# Patient Record
Sex: Male | Born: 1990 | Race: Black or African American | Hispanic: No | Marital: Single | Smoking: Current some day smoker
Health system: Southern US, Community
[De-identification: ages and names within clinical notes are randomized; demographics above are authoritative.]

---
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
## 2012-09-17 ENCOUNTER — Emergency Department: Payer: Self-pay | Admitting: Emergency Medicine

## 2012-09-17 LAB — CBC WITH DIFFERENTIAL/PLATELET
Basophil #: 0 10*3/uL (ref 0.0–0.1)
Basophil %: 0.6 %
Eosinophil #: 0 10*3/uL (ref 0.0–0.7)
HGB: 17 g/dL (ref 13.0–18.0)
Lymphocyte %: 32.2 %
MCH: 33 pg (ref 26.0–34.0)
MCHC: 35.2 g/dL (ref 32.0–36.0)
Monocyte #: 1.1 x10 3/mm — ABNORMAL HIGH (ref 0.2–1.0)
Neutrophil %: 51 %
Platelet: 169 10*3/uL (ref 150–440)
RDW: 13.1 % (ref 11.5–14.5)

## 2012-09-17 LAB — COMPREHENSIVE METABOLIC PANEL
BUN: 11 mg/dL (ref 7–18)
Bilirubin,Total: 1.2 mg/dL — ABNORMAL HIGH (ref 0.2–1.0)
Chloride: 101 mmol/L (ref 98–107)
Co2: 29 mmol/L (ref 21–32)
Creatinine: 1.06 mg/dL (ref 0.60–1.30)
EGFR (African American): >60
EGFR (Non-African Amer.): >60
Osmolality: 281 (ref 275–301)
SGPT (ALT): 42 U/L (ref 12–78)
Sodium: 141 mmol/L (ref 136–145)
Total Protein: 8.9 g/dL — ABNORMAL HIGH (ref 6.4–8.6)

## 2012-09-19 LAB — BETA STREP CULTURE(ARMC)

## 2018-01-29 ENCOUNTER — Emergency Department
Admission: EM | Admit: 2018-01-29 | Discharge: 2018-01-29 | Disposition: A | Discharge status: Home / Self Care | Payer: 59 | Attending: Emergency Medicine | Admitting: Emergency Medicine

## 2018-01-29 ENCOUNTER — Other Ambulatory Visit: Payer: Self-pay

## 2018-01-29 DIAGNOSIS — J020 Streptococcal pharyngitis: Secondary | ICD-10-CM | POA: Insufficient documentation

## 2018-01-29 DIAGNOSIS — R509 Fever, unspecified: Secondary | ICD-10-CM | POA: Diagnosis present

## 2018-01-29 MED ORDER — AMOXICILLIN 875 MG PO TABS: 875.0000 mg | ORAL_TABLET | Freq: Two times a day (BID) | ORAL | 0 refills | Status: AC

## 2018-01-29 NOTE — ED Provider Notes (Signed)
[facility] Emergency Department Provider Note  ____________________________________________  Time seen: Approximately 3:47 PM  I have reviewed the triage vital signs and the nursing notes.   HISTORY  Chief Complaint URI    HPI Stanley Burton is a 27 y.o. male presents to the emergency department with fever, chills, pharyngitis, headache and anterior neck pain for the past 2 days.  Patient reports that he feels like he is "swallowing glass".  He denies congestion, runny cough.  He has had malaise.  He denies chest pain, chest tightness and abdominal pain.  Patient is speaking in complete sentences and managing his own secretions.   No past medical history on file.  Patient Active Problem List   Diagnosis Date Noted  . ACUTE KIDNEY FAILURE UNSPECIFIED 02/02/2011  . RHABDOMYOLYSIS 02/02/2011     Prior to Admission medications   Medication Sig Start Date End Date Taking? Authorizing Provider  amoxicillin (AMOXIL) 875 MG tablet Take 1 tablet (875 mg total) by mouth 2 (two) times daily for 10 days. 01/29/18 02/08/18  Orvil Feil, PA-C    Allergies Patient has no known allergies.  No family history on file.  Social History Social History   Tobacco Use  . Smoking status: Not on file  Substance Use Topics  . Alcohol use: Not on file  . Drug use: Not on file     Review of Systems  Constitutional: Patient has fever.  Eyes: No visual changes. No discharge ENT: Patient has pharyngitis.  Cardiovascular: no chest pain. Respiratory: no cough. No SOB. Musculoskeletal: Negative for musculoskeletal pain. Skin: Negative for rash, abrasions, lacerations, ecchymosis. Neurological: Negative for headaches, focal weakness or numbness.   ____________________________________________   PHYSICAL EXAM:  VITAL SIGNS: ED Triage Vitals  Enc Vitals Group     BP 01/29/18 1425 (!) 114/59     Pulse Rate 01/29/18 1425 98     Resp 01/29/18 1425 16     Temp  01/29/18 1425 98.6 F (37 C)     Temp Source 01/29/18 1425 Oral     SpO2 01/29/18 1425 99 %     Weight 01/29/18 1413 165 lb (74.8 kg)     Height 01/29/18 1413 5\' 11"  (1.803 m)     Head Circumference --      Peak Flow --      Pain Score 01/29/18 1539 6     Pain Loc --      Pain Edu? --      Excl. in GC? --      Constitutional: Alert and oriented. Well appearing and in no acute distress. Eyes: Conjunctivae are normal. PERRL. EOMI. Head: Atraumatic. ENT:      Ears: TMs are pearly.      Nose: No congestion/rhinnorhea.      Mouth/Throat: Mucous membranes are moist.  Posterior pharynx is erythematous with bilateral tonsillar hypertrophy and exudate.  Uvula is midline. Hematological/Lymphatic/Immunilogical: Palpable cervical lymphadenopathy. Cardiovascular: Normal rate, regular rhythm. Normal S1 and S2.  Good peripheral circulation. Respiratory: Normal respiratory effort without tachypnea or retractions. Lungs CTAB. Good air entry to the bases with no decreased or absent breath sounds. Musculoskeletal: Full range of motion to all extremities. No gross deformities appreciated. Neurologic:  Normal speech and language. No gross focal neurologic deficits are appreciated.  Skin:  Skin is warm, dry and intact. No rash noted.  ____________________________________________   LABS (all labs ordered are listed, but only abnormal results are displayed)  Labs Reviewed - No data to display  ____________________________________________  EKG   ____________________________________________  RADIOLOGY   No results found.  ____________________________________________    PROCEDURES  Procedure(s) performed:    Procedures    Medications - No data to display   ____________________________________________   INITIAL IMPRESSION / ASSESSMENT AND PLAN / ED COURSE  Pertinent labs & imaging results that were available during my care of the patient were reviewed by me and considered in my  medical decision making (see chart for details).  Review of the [place] CSRS was performed in accordance of the [facility] prior to dispensing any controlled drugs.    Assessment and plan Strep pharyngitis Patient presents to the emergency department with fever, pharyngitis, tonsillar exudate and palpable cervical lymphadenopathy.  Differential diagnosis originally included strep pharyngitis, unspecified viral URI and influenza.  History and physical exam findings are consistent with strep throat at this time.  Patient was discharged with amoxicillin.  All patient questions were answered.   ____________________________________________  FINAL CLINICAL IMPRESSION(S) / ED DIAGNOSES  Final diagnoses:  Strep pharyngitis      NEW MEDICATIONS STARTED DURING THIS VISIT:  ED Discharge Orders        Ordered    amoxicillin (AMOXIL) 875 MG tablet  2 times daily     01/29/18 1531          This chart was dictated using voice recognition software/Dragon. Despite best efforts to proofread, errors can occur which can change the meaning. Any change was purely unintentional.    Orvil FeilWoods, Minsa Weddington M, PA-C 01/29/18 1553    Jeanmarie PlantMcShane, James A, MD 01/29/18 [phone]

## 2018-01-29 NOTE — ED Triage Notes (Signed)
Sinus drainage, sweats, cold symptoms x 2 days

## 2021-10-07 ENCOUNTER — Ambulatory Visit
Admission: EM | Admit: 2021-10-07 | Discharge: 2021-10-07 | Disposition: A | Discharge status: Home / Self Care | Payer: 59 | Attending: Emergency Medicine | Admitting: Emergency Medicine

## 2021-10-07 ENCOUNTER — Ambulatory Visit (INDEPENDENT_AMBULATORY_CARE_PROVIDER_SITE_OTHER): Payer: 59

## 2021-10-07 ENCOUNTER — Other Ambulatory Visit: Payer: Self-pay

## 2021-10-07 DIAGNOSIS — M545 Low back pain, unspecified: Secondary | ICD-10-CM | POA: Diagnosis not present

## 2021-10-07 DIAGNOSIS — S39012A Strain of muscle, fascia and tendon of lower back, initial encounter: Secondary | ICD-10-CM | POA: Diagnosis not present

## 2021-10-07 MED ORDER — BACLOFEN 10 MG PO TABS: 10.0000 mg | ORAL_TABLET | Freq: Three times a day (TID) | ORAL | 0 refills | Status: DC

## 2021-10-07 MED ORDER — IBUPROFEN 600 MG PO TABS: 600.0000 mg | ORAL_TABLET | Freq: Four times a day (QID) | ORAL | 0 refills | Status: DC | PRN

## 2021-10-07 NOTE — ED Provider Notes (Signed)
[facility]    CSN: [id number] Arrival date & time: 10/07/21  1001      History   Chief Complaint Chief Complaint  Patient presents with   Back Pain    Lower     HPI Stanley Burton is a 30 y.o. male.   HPI  30 year old male here for evaluation of low back pain.  Patient reports that he developed low back pain 9 days ago while doing a dead lift at the gym.  He states that she had been slowly increasing weight until he maxed out at 475 pounds.  He states that he was not wearing his weight belt when he did the left.  He states when he first took a strain he felt 3 pops in his low back but then continued through to complete the dead lift before dropping the weight.  He states that since then he has had continuous pain in his low back.  He also reports that a couple days after the pain developed he noticed some bruising but states that the bruising has resolved.  He states that the pain increases with forward flexion and lateral rotation but not with lateral flexion.  He denies numbness, tingling, or weakness in any of his legs or loss of bowel or bladder control.  History reviewed. No pertinent past medical history.  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 No pertinent surgical history.     Home Medications    Prior to Admission medications   Medication Sig Start Date End Date Taking? Authorizing Provider  baclofen (LIORESAL) 10 MG tablet Take 1 tablet (10 mg total) by mouth 3 (three) times daily. 10/07/21  Yes Becky Augusta, NP  ibuprofen (ADVIL) 600 MG tablet Take 1 tablet (600 mg total) by mouth every 6 (six) hours as needed. 10/07/21  Yes Becky Augusta, NP    Family History History reviewed. No pertinent family history.  Social History Social History   Tobacco Use   Smoking status: Some Days    Types: Cigarettes   Smokeless tobacco: Never   Vaping Use   Vaping Use: Never used     Allergies   Patient has no known allergies.   Review of Systems Review of Systems  Constitutional:  Negative for activity change, appetite change and fever.  Musculoskeletal:  Positive for back pain.  Skin:  Positive for color change.  Neurological:  Negative for weakness and numbness.  Hematological: Negative.   Psychiatric/Behavioral: Negative.      Physical Exam Triage Vital Signs ED Triage Vitals  Enc Vitals Group     BP 10/07/21 1041 117/62     Pulse Rate 10/07/21 1041 (!) 57     Resp 10/07/21 1041 18     Temp 10/07/21 1041 97.9 F (36.6 C)     Temp Source 10/07/21 1041 Oral     SpO2 10/07/21 1041 100 %     Weight 10/07/21 1039 185 lb (83.9 kg)     Height 10/07/21 1039 5\' 11"  (1.803 m)     Head Circumference --      Peak Flow --      Pain Score 10/07/21 1038 8     Pain Loc --      Pain Edu? --      Excl. in GC? --    No data found.  Updated Vital Signs BP 117/62 (BP Location: Left Arm)  Pulse (!) 57   Temp 97.9 F (36.6 C) (Oral)   Resp 18   Ht 5\' 11"  (1.803 m)   Wt 185 lb (83.9 kg)   SpO2 100%   BMI 25.80 kg/m   Visual Acuity Right Eye Distance:   Left Eye Distance:   Bilateral Distance:    Right Eye Near:   Left Eye Near:    Bilateral Near:     Physical Exam Vitals and nursing note reviewed.  Constitutional:      General: He is not in acute distress.    Appearance: Normal appearance. He is not ill-appearing.  HENT:     Head: Normocephalic and atraumatic.  Cardiovascular:     Rate and Rhythm: Normal rate and regular rhythm.     Pulses: Normal pulses.     Heart sounds: Normal heart sounds. No murmur heard.   No gallop.  Pulmonary:     Effort: Pulmonary effort is normal.     Breath sounds: Normal breath sounds. No wheezing, rhonchi or rales.  Musculoskeletal:        General: Tenderness present. No swelling or deformity.  Skin:    General: Skin is warm and dry.     Capillary Refill:  Capillary refill takes less than 2 seconds.     Findings: No bruising or erythema.  Neurological:     General: No focal deficit present.     Mental Status: He is alert and oriented to person, place, and time.     Sensory: No sensory deficit.     Motor: No weakness.  Psychiatric:        Mood and Affect: Mood normal.        Behavior: Behavior normal.        Thought Content: Thought content normal.        Judgment: Judgment normal.     UC Treatments / Results  Labs (all labs ordered are listed, but only abnormal results are displayed) Labs Reviewed - No data to display  EKG   Radiology DG Lumbar Spine Complete  Result Date: 10/07/2021 CLINICAL DATA:  Pain in lower back after 475 pound dead lift EXAM: LUMBAR SPINE - COMPLETE 4+ VIEW COMPARISON:  CT AP 01/26/2011 FINDINGS: Six lumbar type vertebral bodies are identified with sacralization of the L6 vertebra. Mild curvature of the thoracolumbar spine is convex towards the left. There is no evidence of lumbar spine fracture. Alignment is normal. Intervertebral disc spaces are maintained. IMPRESSION: No acute findings. Electronically Signed   By: 01/28/2011 M.D.   On: 10/07/2021 12:10    Procedures Procedures (including critical care time)  Medications Ordered in UC Medications - No data to display  Initial Impression / Assessment and Plan / UC Course  I have reviewed the triage vital signs and the nursing notes.  Pertinent labs & imaging results that were available during my care of the patient were reviewed by me and considered in my medical decision making (see chart for details).  Patient is a nontoxic-appearing 30 year old male here for evaluation of lumbar spine pain that is in the midline that is been present for the last 9 days.  The pain developed as the patient was doing a dead lift at his maximum weight of 475 pounds.  He states that when he initially took tension on the bar he felt 3 pops in his lower back but he  continued through with the remainder of the left.  He states rather than set the weight down he did  drop it at that point.  He has had no numbness, tingling, weakness in any of extremities and he does not have any incontinence or loss of bowel or bladder control.  He states that he was some bruising in his lower lumbar area that has since resolved.  The bruising developed a few days after he felt the pain.  Patient's physical exam reveals patient with a normal axial carriage.  He is able to transition from standing to sitting and vice versa.  There is no bruising visible on the low back upon inspection.  Patient does have midline spinous process tenderness from L3-S1.  There is tightness in the bilateral paraspinous regions but patient is not complaining of pain with palpation of those areas.  Patient can get approximately 15 degrees of forward flexion before he starts to feel pain and he can get approximately 45 degrees of lateral rotation in both directions before he invokes pain.  Lateral flexion does not cause pain at all.  Patient's bilateral lower extremity strength is 5/5.  Patient's lower extremity DTRs are nonreactive bilaterally.  He states that this is not a new finding for him he has never had any triggering with testing of his patellar or Achilles reflexes.  We will obtain lumbar spine films to look for bony abnormality such as compression wedge fracture or for loss of disc height.  Lumbar spine films independently reviewed and evaluated by me.  Impression: There is a loss of lumbar lordosis but disc spaces are well-maintained.  There is no abnormalities to any of the vertebral bodies.  All the spinous processes are intact.  Radiology overread is pending. Radiology impression is negative for acute findings.  We will treat patient for lumbar strain with NSAIDs, baclofen, rest, and home physical therapy.   Final Clinical Impressions(s) / UC Diagnoses   Final diagnoses:  Strain of lumbar region,  initial encounter     Discharge Instructions      Take the ibuprofen, 600 mg every 6 hours with food, on a schedule for the next 48 hours and then as needed.  Take the baclofen, 10 mg every 8 hours, on a schedule for the next 48 hours and then as needed.  Apply moist heat to your back for 30 minutes at a time 2-3 times a day to improve blood flow to the area and help remove the lactic acid causing the spasm.  Follow the back exercises given at discharge.  Avoid any heavy lifting or back strain until after symptoms have completely resolved.  Return for reevaluation for any new or worsening symptoms.      ED Prescriptions     Medication Sig Dispense Auth. Provider   baclofen (LIORESAL) 10 MG tablet Take 1 tablet (10 mg total) by mouth 3 (three) times daily. 30 each Becky Augusta, NP   ibuprofen (ADVIL) 600 MG tablet Take 1 tablet (600 mg total) by mouth every 6 (six) hours as needed. 30 tablet Becky Augusta, NP      PDMP not reviewed this encounter.   Becky Augusta, NP 10/07/21 1226

## 2021-10-07 NOTE — Discharge Instructions (Addendum)
Take the ibuprofen, 600 mg every 6 hours with food, on a schedule for the next 48 hours and then as needed.  Take the baclofen, 10 mg every 8 hours, on a schedule for the next 48 hours and then as needed.  Apply moist heat to your back for 30 minutes at a time 2-3 times a day to improve blood flow to the area and help remove the lactic acid causing the spasm.  Follow the back exercises given at discharge.  Avoid any heavy lifting or back strain until after symptoms have completely resolved.  Return for reevaluation for any new or worsening symptoms.

## 2021-10-07 NOTE — ED Triage Notes (Signed)
Pt c/o back pain since doing a dead-lift of 475lbs April 22, 2023 a week ago, states he felt three "pops" when lifting. Pt states within a few days he did notice some bruising, and the pain has increased, worse with movement/ambulation.

## 2022-02-25 IMAGING — CR DG LUMBAR SPINE COMPLETE 4+V
5 series · 5 of 5 positions shown · non-contrast
Comparison: CT AP 01/26/2011

CLINICAL DATA: Pain in lower back after 475 pound dead lift

EXAM:
LUMBAR SPINE - COMPLETE 4+ VIEW

[l-spine ap]
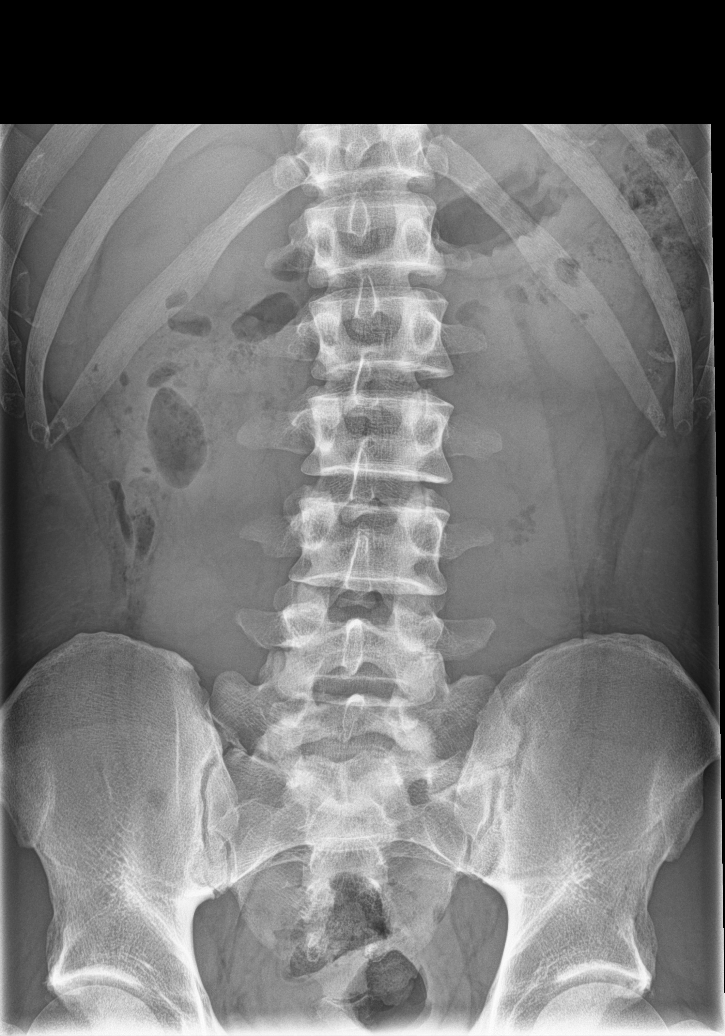

[l-spine obl (1 of 2)]
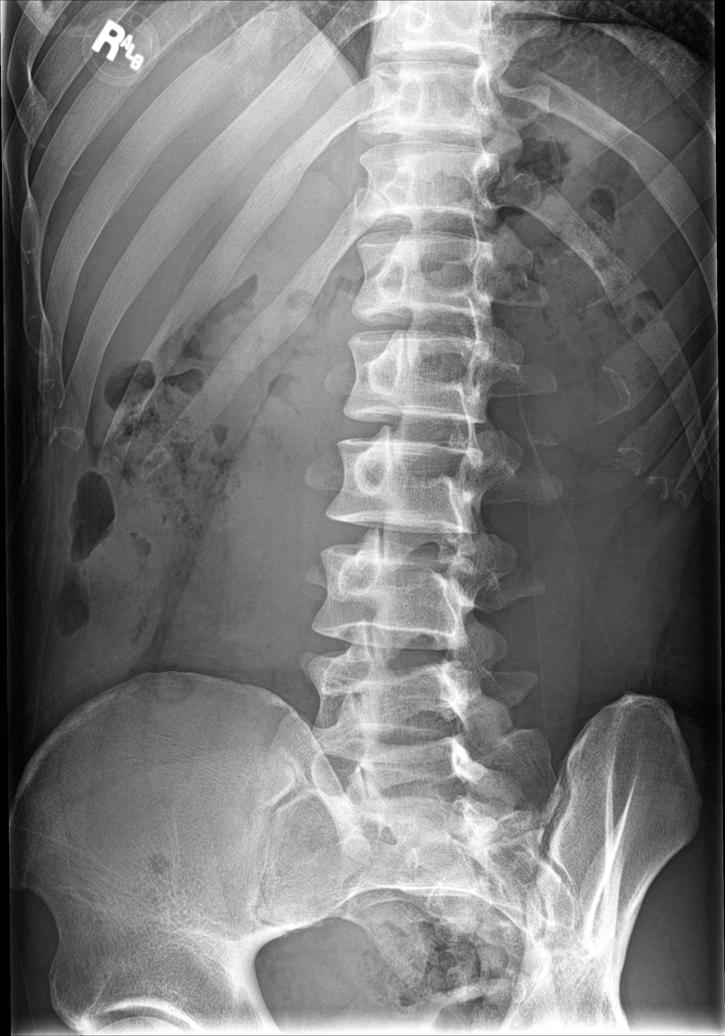

[l-spine obl (2 of 2)]
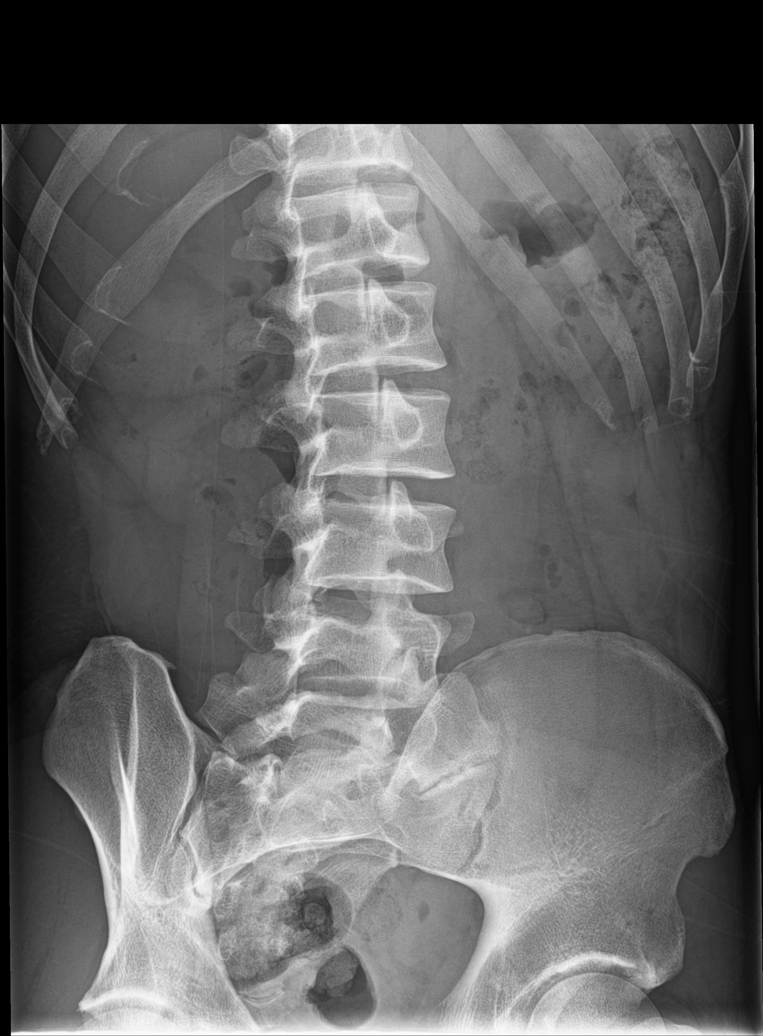

[l-spine lat]
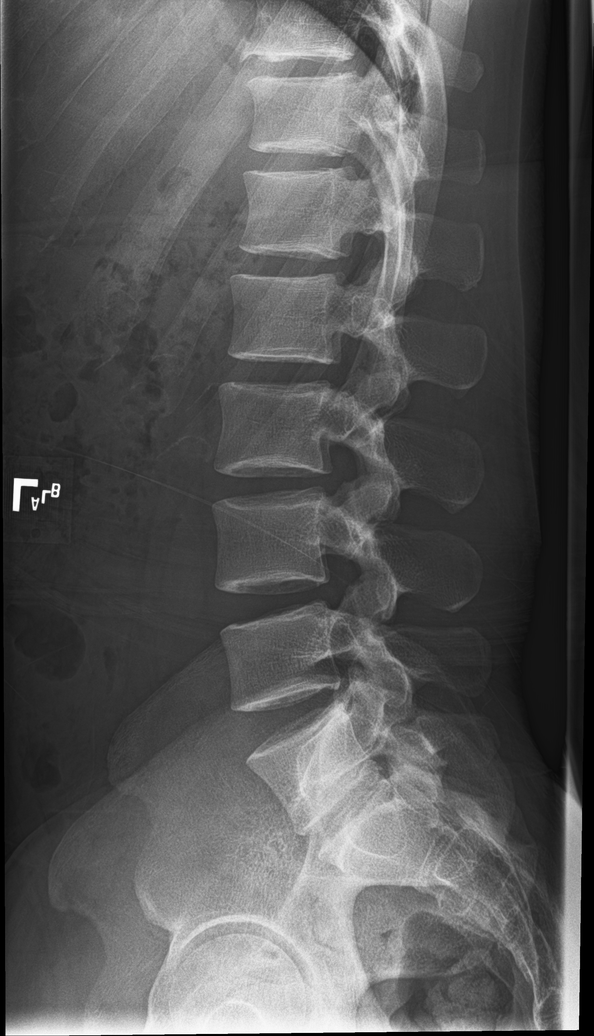

[l-spine spot]
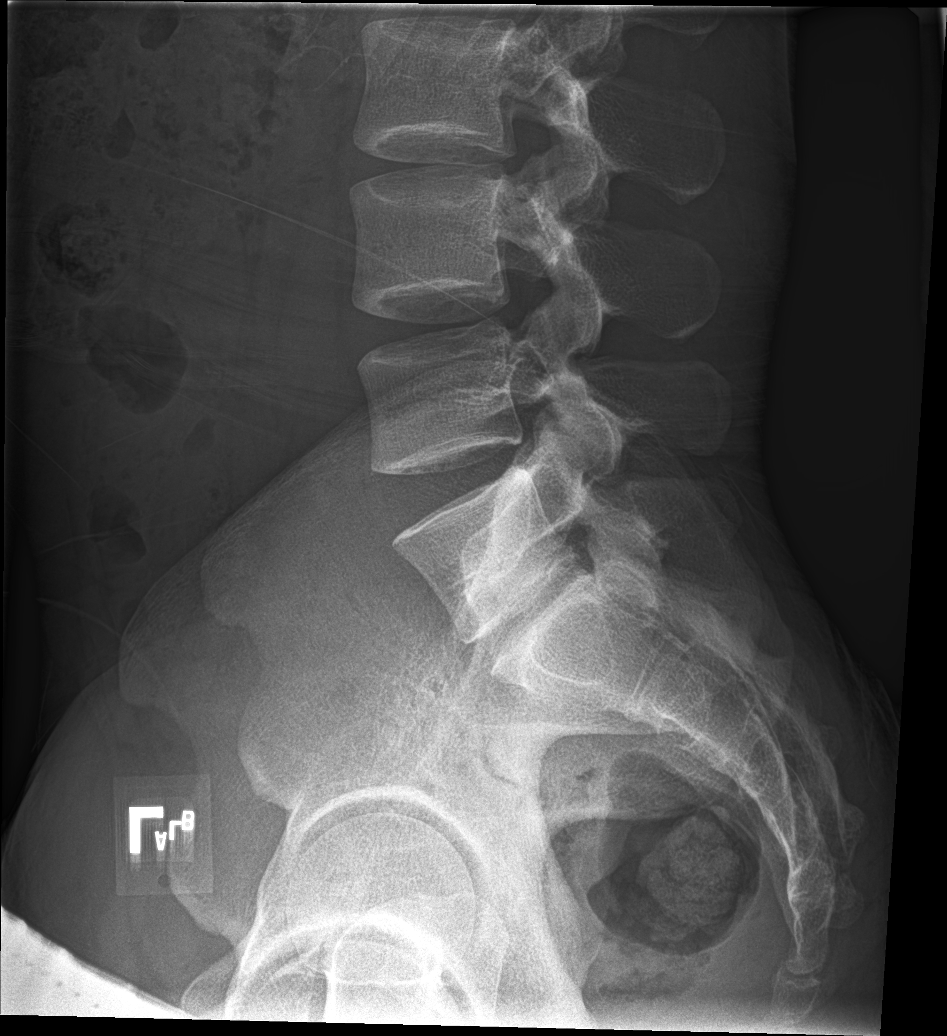

[5 of 5 positions shown; findings below may reference images not displayed]

FINDINGS: Six lumbar type vertebral bodies are identified with sacralization
of the L6 vertebra. Mild curvature of the thoracolumbar spine is
convex towards the left. There is no evidence of lumbar spine
fracture. Alignment is normal. Intervertebral disc spaces are
maintained.
IMPRESSION: No acute findings.

## 2023-11-14 ENCOUNTER — Ambulatory Visit: Payer: Self-pay | Admitting: Family Medicine

## 2023-11-14 ENCOUNTER — Encounter: Payer: Self-pay | Admitting: Family Medicine

## 2023-11-14 ENCOUNTER — Other Ambulatory Visit: Payer: Self-pay

## 2023-11-14 DIAGNOSIS — Z113 Encounter for screening for infections with a predominantly sexual mode of transmission: Secondary | ICD-10-CM

## 2023-11-14 DIAGNOSIS — F419 Anxiety disorder, unspecified: Secondary | ICD-10-CM

## 2023-11-14 LAB — HM HIV SCREENING LAB: HM HIV Screening: NEGATIVE

## 2023-11-14 LAB — HM HEPATITIS C SCREENING LAB: HM Hepatitis Screen: NEGATIVE

## 2023-11-14 LAB — HEPATITIS B SURFACE ANTIGEN

## 2023-11-14 LAB — GRAM STAIN

## 2023-11-14 NOTE — Progress Notes (Signed)
Pt is here for STD screening. Condoms given and counseled on the lab results. Sonda Primes, RN.

## 2023-11-14 NOTE — Progress Notes (Signed)

## 2023-11-14 NOTE — Progress Notes (Signed)
[facility] Department STI clinic/screening visit  Subjective:  Stanley Burton is a 32 y.o. male being seen today for an STI screening visit. The patient reports they do have symptoms.    Patient has the following medical conditions:   Patient Active Problem List   Diagnosis Date Noted   ACUTE KIDNEY FAILURE UNSPECIFIED 02/02/2011   RHABDOMYOLYSIS 02/02/2011     Chief Complaint  Patient presents with   SEXUALLY TRANSMITTED DISEASE    Screening with no symptoms     Patient presents for STI testing. He reports ending a relationship in the last month and wanting to be screened prior to a new sexual relationship. He endorses tingling at the tip of his penis with urination at times that began 1-2 weeks after last relationship ended. He denies all other STI symtpoms.  Patient has male partners and practices oral and insertive penile/vaginal sex. He sometimes uses condoms. He does not desire pregnancy in the next year.  Additionally, patient reports anxiety and depression that he self treats with marijuana and alcohol. He also endorses isolating himself.   Last HIV test per patient/review of record was No results found for: "HMHIVSCREEN"  Lab Results  Component Value Date   HIV NON REACTIVE 01/27/2011    Last HEPC test per patient/review of record was No results found for: "HMHEPCSCREEN" No components found for: "HEPC"   Last HEPB test per patient/review of record was No components found for: "HMHEPBSCREEN" No components found for: "HEPC"   Does the patient or their partner desires a pregnancy in the next year? No  Screening for MPX risk: Does the patient have an unexplained rash? No Is the patient MSM? No Does the patient endorse multiple sex partners or anonymous sex partners? Yes Did the patient have close or sexual contact with a person diagnosed with MPX? No Has the patient traveled outside the [place] where MPX is endemic? No Is there a high clinical suspicion for  MPX-- evidenced by one of the following No  -Unlikely to be chickenpox  -Lymphadenopathy  -Rash that present in same phase of evolution on any given body part   See flowsheet for further details and programmatic requirements.    There is no immunization history on file for this patient.   The following portions of the patient's history were reviewed and updated as appropriate: allergies, current medications, past medical history, past social history, past surgical history and problem list.  Objective:  There were no vitals filed for this visit.  Physical Exam Nursing note reviewed. Exam conducted with a chaperone present Stanley Langton Oppong, RN present as chaperone during exam.).  Constitutional:      Appearance: Normal appearance.  HENT:     Head: Normocephalic and atraumatic.     Salivary Glands: Right salivary gland is not diffusely enlarged or tender. Left salivary gland is not diffusely enlarged or tender.     Comments: No nits or hair loss    Mouth/Throat:     Lips: Pink. No lesions.     Mouth: Mucous membranes are moist. No oral lesions.     Dentition: Abnormal dentition. Dental caries present.     Tongue: No lesions. Tongue does not deviate from midline.     Pharynx: Oropharynx is clear. Uvula midline. No oropharyngeal exudate, posterior oropharyngeal erythema or uvula swelling.     Tonsils: No tonsillar exudate.  Eyes:     General:        Right eye: No discharge.  Left eye: No discharge.     Conjunctiva/sclera:     Right eye: Right conjunctiva is not injected. No exudate.    Left eye: Left conjunctiva is not injected. No exudate. Pulmonary:     Effort: Pulmonary effort is normal.  Genitourinary:    Pubic Area: No rash or pubic lice (no nits).      Penis: Circumcised. Erythema present. No tenderness, discharge, swelling or lesions.      Testes: Normal.        Right: Mass, tenderness or swelling not present.        Left: Mass, tenderness or swelling not present.      Epididymis:     Right: Normal. No mass or tenderness.     Left: Normal. No mass or tenderness.     Tanner stage (genital): 5.     Rectum: Tenderness: no lesions or discharge.       Comments: Penile Discharge Amount: none Color:  n/a Erythema to the tip of the penis.  Lymphadenopathy:     Head:     Right side of head: No submental, submandibular, tonsillar, preauricular or posterior auricular adenopathy.     Left side of head: No submental, submandibular, tonsillar, preauricular or posterior auricular adenopathy.     Cervical: No cervical adenopathy.     Right cervical: No superficial or posterior cervical adenopathy.    Left cervical: No superficial or posterior cervical adenopathy.     Upper Body:     Right upper body: No supraclavicular or axillary adenopathy.     Left upper body: No supraclavicular or axillary adenopathy.     Lower Body: No right inguinal adenopathy. No left inguinal adenopathy.  Skin:    General: Skin is warm and dry.     Findings: No lesion or rash.     Comments: Skin tone appropriate for ethnicity.   Neurological:     Mental Status: He is alert and oriented to person, place, and time.  Psychiatric:        Attention and Perception: Attention normal.        Mood and Affect: Affect normal. Mood is anxious.        Speech: Speech normal.        Behavior: Behavior normal. Behavior is cooperative.        Thought Content: Thought content normal.    Assessment and Plan:  Stanley Burton is a 32 y.o. male presenting to the [facility] Department for STI screening  1. Screening for venereal disease Testing performed per patient request and protocol.   - Gonococcus culture - Gram stain - Chlamydia/GC NAA, Confirmation - HBV Antigen/Antibody [facility] - HIV/HCV [facility] - Syphilis Serology, [place] [facility]  2. Anxiety Patient self-reports anxiety. Offered referral to Kathreen Cosier, LCSW patient accepted card, but stated he was not ready at  this time to open up to anyone.   Patient does have STI symptoms Patient accepted all screenings including  urine GC/Chlamydia, and blood work for HIV/Syphilis. Patient meets criteria for HepB screening? Yes. Ordered? yes Patient meets criteria for HepC screening? Yes. Ordered? yes Recommended condom use with all sex Discussed importance of condom use for STI prevent  Treat positive test results per standing order. Discussed time line for State Lab results and that patient will be called with positive results and encouraged patient to call if he had not heard in 2 weeks Recommended repeat testing in 3 months with positive results. Recommended returning for continued  or worsening symptoms.   Return if symptoms worsen or fail to improve.  No future appointments.  Total time with patient 30 minutes.   Edmonia James, NP

## 2023-11-18 ENCOUNTER — Encounter: Payer: Self-pay | Admitting: Family Medicine

## 2023-11-19 LAB — CHLAMYDIA/GC NAA, CONFIRMATION
Chlamydia trachomatis, NAA: NEGATIVE
Neisseria gonorrhoeae, NAA: NEGATIVE

## 2023-11-19 LAB — GONOCOCCUS CULTURE

## 2023-12-14 NOTE — Addendum Note (Signed)
Addended by: Heywood Bene on: 12/14/2023 02:28 PM   Modules accepted: Orders

## 2024-08-03 ENCOUNTER — Encounter: Payer: Self-pay | Admitting: Emergency Medicine

## 2024-08-03 ENCOUNTER — Ambulatory Visit: Admission: EM | Admit: 2024-08-03 | Discharge: 2024-08-03 | Disposition: A | Discharge status: Home / Self Care

## 2024-08-03 DIAGNOSIS — M5442 Lumbago with sciatica, left side: Secondary | ICD-10-CM | POA: Diagnosis not present

## 2024-08-03 DIAGNOSIS — M5441 Lumbago with sciatica, right side: Secondary | ICD-10-CM | POA: Diagnosis not present

## 2024-08-03 MED ORDER — DEXAMETHASONE SODIUM PHOSPHATE 10 MG/ML IJ SOLN
10.0000 mg | Freq: Once | INTRAMUSCULAR | Status: AC
  Administered 2024-08-03: 10 mg via INTRAMUSCULAR

## 2024-08-03 MED ORDER — BACLOFEN 10 MG PO TABS: 10.0000 mg | ORAL_TABLET | Freq: Three times a day (TID) | ORAL | 0 refills | Status: AC

## 2024-08-03 NOTE — Discharge Instructions (Addendum)
Take the prednisone according to the package instructions.  You will take it each morning with breakfast.  Make sure that you are always taking it with food.  Take the baclofen every 8 hours as needed for muscle spasm.  Follow the rehabilitation exercises in your discharge paperwork to help you with your sciatic pain.  Return for reevaluation for new or worsening symptoms, or see your primary care provider.  

## 2024-08-03 NOTE — ED Triage Notes (Signed)
 Pt c/o lower back pain. Started about 5 days ago. He states his back feels stiff. He has tried muscle relaxers from his mother. He was seen at an UC in [place] and given prednisone and Tizanidine. He states his back is not getting better. He states the pain radiates down his legs if he bends over.

## 2024-08-03 NOTE — ED Provider Notes (Signed)
 [facility]    CSN: [id number] Arrival date & time: 08/03/24  0815      History   Chief Complaint Chief Complaint  Patient presents with   Back Pain    HPI Stanley Burton is a 33 y.o. male.   HPI  33 year old male with history of rhabdomyolysis presents for evaluation of 5 days worth of low back pain.  He reports he feels like his back is tight and stiff.  He was using muscle laxer's provided by his mother and then went to [facility] in [place].  He was given a prednisone taper, which she is still on, and tizanidine.  He reports that those medications have not helped his symptoms.  He is now experiencing pain radiating down into both legs.  History reviewed. No pertinent past medical history.  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 No pertinent surgical history.     Home Medications    Prior to Admission medications   Medication Sig Start Date End Date Taking? Authorizing Provider  predniSONE (DELTASONE) 10 MG tablet Take by mouth. 08/01/24  Yes [provider]  baclofen  (LIORESAL ) 10 MG tablet Take 1 tablet (10 mg total) by mouth 3 (three) times daily. 08/03/24   Bernardino Ditch, NP    Family History History reviewed. No pertinent family history.  Social History Social History   Tobacco Use   Smoking status: Some Days    Types: Cigars    Start date: 2019   Smokeless tobacco: Never  Vaping Use   Vaping status: Former   Quit date: 04/26/2022   Substances: THC  Substance Use Topics   Alcohol use: Yes    Alcohol/week: 23.0 standard drinks of alcohol    Types: 18 Cans of beer, 5 Shots of liquor per week    Comment: Last drank 1 day ago- social occassions   Drug use: Yes    Frequency: 7.0 times per week    Types: Marijuana    Comment: Last used today-daily use     Allergies   Patient has no known allergies.   Review of  Systems Review of Systems  Musculoskeletal:  Positive for back pain.  Neurological:  Negative for weakness and numbness.     Physical Exam Triage Vital Signs ED Triage Vitals  Encounter Vitals Group     BP      Girls Systolic BP Percentile      Girls Diastolic BP Percentile      Boys Systolic BP Percentile      Boys Diastolic BP Percentile      Pulse      Resp      Temp      Temp src      SpO2      Weight      Height      Head Circumference      Peak Flow      Pain Score      Pain Loc      Pain Education      Exclude from Growth Chart    No data found.  Updated Vital Signs BP 119/71 (BP Location: Right Arm)   Pulse (!) 59   Temp 98.1 F (36.7 C) (Oral)   Resp 16   Ht 5' 11 (1.803 m)   Wt 184 lb 15.5 oz (83.9 kg)   SpO2 95%   BMI 25.80 kg/m  Visual Acuity Right Eye Distance:   Left Eye Distance:   Bilateral Distance:    Right Eye Near:   Left Eye Near:    Bilateral Near:     Physical Exam Vitals and nursing note reviewed.  Constitutional:      Appearance: Normal appearance. He is not ill-appearing.  HENT:     Head: Normocephalic and atraumatic.  Musculoskeletal:        General: Tenderness present. No signs of injury.  Skin:    General: Skin is warm and dry.     Capillary Refill: Capillary refill takes less than 2 seconds.     Findings: No bruising or erythema.  Neurological:     General: No focal deficit present.     Mental Status: He is alert and oriented to person, place, and time.      UC Treatments / Results  Labs (all labs ordered are listed, but only abnormal results are displayed) Labs Reviewed - No data to display  EKG   Radiology No results found.  Procedures Procedures (including critical care time)  Medications Ordered in UC Medications  dexamethasone  (DECADRON ) injection 10 mg (has no administration in time range)    Initial Impression / Assessment and Plan / UC Course  I have reviewed the triage vital signs and  the nursing notes.  Pertinent labs & imaging results that were available during my care of the patient were reviewed by me and considered in my medical decision making (see chart for details).   Patient is a pleasant, nontoxic-appearing 33 year old male presenting for evaluation of 5 days of low back pain.  As mentioned in HPI above, he has been seen at a previous urgent care and was prescribed prednisone taper and tizanidine, which she reports is not helping.  He was not given any home physical therapy.  On exam he has no midline spinous process tenderness in the lower thoracic or lumbar spine.  His lumbar paraspinous region is tight bilaterally but no overt tenderness.  He does have tenderness with palpating along the path of the sciatic nerve through both legs.  Forward flexion is nearly impossible given the patient's pain and stiffness.  Lateral flexion is mildly decreased.  Lateral rotation is not decreased at all.  He denies any saddle anesthesia or loss of bowel or bladder control.  I suspect that this is musculoskeletal in nature.  Given that he is already on prednisone taper I advised him that we could give him an injection of Decadron  to try and boost when he is taking by mouth and try changing his muscle relaxer.  Additionally, I will give him home physical therapy exercises to perform.  However, if these do not improve his symptoms he needs to follow-up with orthopedics and I recommended [facility] here in [place] and in [place].  Work note provided.   Final Clinical Impressions(s) / UC Diagnoses   Final diagnoses:  Acute bilateral low back pain with bilateral sciatica     Discharge Instructions      Take the prednisone according to the package instructions.  You will take it each morning with breakfast.  Make sure that you are always taking it with food.  Take the baclofen  every 8 hours as needed for muscle spasm.  Follow the rehabilitation exercises in your discharge paperwork  to help you with your sciatic pain.  Return for reevaluation for new or worsening symptoms, or see your primary care provider.      ED Prescriptions  Medication Sig Dispense Auth. Provider   baclofen  (LIORESAL ) 10 MG tablet Take 1 tablet (10 mg total) by mouth 3 (three) times daily. 30 each Bernardino Ditch, NP      PDMP not reviewed this encounter.   Bernardino Ditch, NP 08/03/24 [phone]
